# Patient Record
Sex: Male | Born: 1935 | Race: White | Hispanic: No | Marital: Married
Health system: Southern US, Community
[De-identification: ages and names within clinical notes are randomized; demographics above are authoritative.]

---

## 2004-10-29 ENCOUNTER — Inpatient Hospital Stay: Payer: Self-pay | Admitting: Infectious Diseases

## 2004-10-29 ENCOUNTER — Other Ambulatory Visit: Payer: Self-pay

## 2004-10-30 ENCOUNTER — Other Ambulatory Visit: Payer: Self-pay

## 2004-10-31 ENCOUNTER — Other Ambulatory Visit: Payer: Self-pay

## 2004-11-16 ENCOUNTER — Ambulatory Visit: Payer: Self-pay | Admitting: Nurse Practitioner

## 2004-12-28 ENCOUNTER — Ambulatory Visit: Payer: Self-pay | Admitting: Nurse Practitioner

## 2005-07-12 ENCOUNTER — Ambulatory Visit: Payer: Self-pay | Admitting: Family Medicine

## 2006-10-09 ENCOUNTER — Ambulatory Visit: Payer: Self-pay | Admitting: *Deleted

## 2006-12-25 ENCOUNTER — Ambulatory Visit: Payer: Self-pay | Admitting: Family Medicine

## 2007-04-03 ENCOUNTER — Ambulatory Visit: Payer: Self-pay | Admitting: Specialist

## 2010-06-11 ENCOUNTER — Ambulatory Visit: Payer: Self-pay | Admitting: Unknown Physician Specialty

## 2011-11-01 ENCOUNTER — Ambulatory Visit: Payer: Self-pay | Admitting: Internal Medicine

## 2012-06-28 ENCOUNTER — Ambulatory Visit: Payer: Self-pay | Admitting: Family Medicine

## 2012-07-17 ENCOUNTER — Ambulatory Visit: Payer: Self-pay | Admitting: Family Medicine

## 2012-08-13 ENCOUNTER — Ambulatory Visit: Payer: Self-pay | Admitting: Internal Medicine

## 2012-09-18 ENCOUNTER — Ambulatory Visit: Payer: Self-pay | Admitting: Internal Medicine

## 2012-12-14 ENCOUNTER — Ambulatory Visit: Payer: Self-pay | Admitting: Family Medicine

## 2013-02-01 ENCOUNTER — Inpatient Hospital Stay: Payer: Self-pay | Admitting: Student

## 2013-02-01 LAB — BASIC METABOLIC PANEL
BUN: 16 mg/dL (ref 7–18)
Calcium, Total: 8.8 mg/dL (ref 8.5–10.1)
Co2: 24 mmol/L (ref 21–32)
Creatinine: 1.11 mg/dL (ref 0.60–1.30)
EGFR (African American): >60
Osmolality: 276 (ref 275–301)

## 2013-02-01 LAB — TROPONIN I: Troponin-I: <0.02 ng/mL

## 2013-02-01 LAB — URINALYSIS, COMPLETE
Bacteria: NONE SEEN
Bilirubin,UR: NEGATIVE
Blood: NEGATIVE
Glucose,UR: NEGATIVE mg/dL (ref 0–75)
Ph: 5 (ref 4.5–8.0)
Protein: 100
Specific Gravity: 1.031 (ref 1.003–1.030)
Squamous Epithelial: NONE SEEN

## 2013-02-01 LAB — CBC WITH DIFFERENTIAL/PLATELET
Eosinophil #: 0 10*3/uL (ref 0.0–0.7)
Eosinophil %: 0.1 %
HCT: 41.2 % (ref 40.0–52.0)
HGB: 13.3 g/dL (ref 13.0–18.0)
Lymphocyte #: 1 10*3/uL (ref 1.0–3.6)
Lymphocyte %: 5.8 %
MCH: 28.4 pg (ref 26.0–34.0)
MCHC: 32.3 g/dL (ref 32.0–36.0)
Monocyte #: 2.2 x10 3/mm — ABNORMAL HIGH (ref 0.2–1.0)
Neutrophil #: 13.6 10*3/uL — ABNORMAL HIGH (ref 1.4–6.5)
RDW: 14.5 % (ref 11.5–14.5)
WBC: 16.9 10*3/uL — ABNORMAL HIGH (ref 3.8–10.6)

## 2013-02-02 LAB — BASIC METABOLIC PANEL
Anion Gap: 9 (ref 7–16)
BUN: 14 mg/dL (ref 7–18)
Chloride: 104 mmol/L (ref 98–107)
Co2: 25 mmol/L (ref 21–32)
Creatinine: 1.11 mg/dL (ref 0.60–1.30)
EGFR (African American): >60
EGFR (Non-African Amer.): 60 — ABNORMAL LOW
Osmolality: 277 (ref 275–301)
Sodium: 138 mmol/L (ref 136–145)

## 2013-02-02 LAB — CBC WITH DIFFERENTIAL/PLATELET
Basophil #: 0.1 10*3/uL (ref 0.0–0.1)
Eosinophil #: 0 10*3/uL (ref 0.0–0.7)
HCT: 38.3 % — ABNORMAL LOW (ref 40.0–52.0)
Lymphocyte %: 7.3 %
MCV: 88 fL (ref 80–100)
Monocyte #: 1.7 x10 3/mm — ABNORMAL HIGH (ref 0.2–1.0)
Monocyte %: 12.8 %
Neutrophil %: 79.3 %
RBC: 4.36 10*6/uL — ABNORMAL LOW (ref 4.40–5.90)
RDW: 14.3 % (ref 11.5–14.5)

## 2013-02-03 LAB — CBC WITH DIFFERENTIAL/PLATELET
Basophil #: 0.1 10*3/uL (ref 0.0–0.1)
Basophil %: 0.7 %
Eosinophil #: 0.2 10*3/uL (ref 0.0–0.7)
Eosinophil %: 1.9 %
HCT: 39.6 % — ABNORMAL LOW (ref 40.0–52.0)
Lymphocyte #: 0.9 10*3/uL — ABNORMAL LOW (ref 1.0–3.6)
Lymphocyte %: 7.6 %
MCHC: 32.1 g/dL (ref 32.0–36.0)
Neutrophil #: 8.8 10*3/uL — ABNORMAL HIGH (ref 1.4–6.5)
Platelet: 200 10*3/uL (ref 150–440)
RBC: 4.49 10*6/uL (ref 4.40–5.90)
RDW: 14.5 % (ref 11.5–14.5)
WBC: 11.6 10*3/uL — ABNORMAL HIGH (ref 3.8–10.6)

## 2013-02-06 LAB — CREATININE, SERUM
Creatinine: 1.07 mg/dL (ref 0.60–1.30)
EGFR (African American): >60
EGFR (Non-African Amer.): >60

## 2013-02-06 LAB — CULTURE, BLOOD (SINGLE)

## 2013-03-01 ENCOUNTER — Ambulatory Visit: Payer: Self-pay | Admitting: Internal Medicine

## 2013-04-22 ENCOUNTER — Ambulatory Visit: Payer: Self-pay | Admitting: Internal Medicine

## 2013-06-26 ENCOUNTER — Inpatient Hospital Stay: Payer: Self-pay | Admitting: Internal Medicine

## 2013-06-26 LAB — COMPREHENSIVE METABOLIC PANEL
ALBUMIN: 2.9 g/dL — AB (ref 3.4–5.0)
ALK PHOS: 124 U/L — AB
Anion Gap: 8 (ref 7–16)
BUN: 16 mg/dL (ref 7–18)
Bilirubin,Total: 1 mg/dL (ref 0.2–1.0)
Calcium, Total: 9.4 mg/dL (ref 8.5–10.1)
Chloride: 104 mmol/L (ref 98–107)
Co2: 25 mmol/L (ref 21–32)
Creatinine: 0.95 mg/dL (ref 0.60–1.30)
GLUCOSE: 107 mg/dL — AB (ref 65–99)
OSMOLALITY: 275 (ref 275–301)
POTASSIUM: 3.4 mmol/L — AB (ref 3.5–5.1)
SGOT(AST): 30 U/L (ref 15–37)
SGPT (ALT): 17 U/L (ref 12–78)
SODIUM: 137 mmol/L (ref 136–145)
Total Protein: 8.4 g/dL — ABNORMAL HIGH (ref 6.4–8.2)

## 2013-06-26 LAB — CBC
HCT: 43.1 % (ref 40.0–52.0)
HGB: 14.1 g/dL (ref 13.0–18.0)
MCH: 28.3 pg (ref 26.0–34.0)
MCHC: 32.6 g/dL (ref 32.0–36.0)
MCV: 87 fL (ref 80–100)
PLATELETS: 248 10*3/uL (ref 150–440)
RBC: 4.96 10*6/uL (ref 4.40–5.90)
RDW: 14.8 % — AB (ref 11.5–14.5)
WBC: 14.7 10*3/uL — ABNORMAL HIGH (ref 3.8–10.6)

## 2013-06-26 LAB — TROPONIN I: Troponin-I: <0.02 ng/mL

## 2013-06-26 LAB — PRO B NATRIURETIC PEPTIDE: B-Type Natriuretic Peptide: 1009 pg/mL — ABNORMAL HIGH (ref 0–450)

## 2013-06-27 LAB — CBC WITH DIFFERENTIAL/PLATELET
BASOS ABS: 0 10*3/uL (ref 0.0–0.1)
BASOS PCT: 0.1 %
EOS ABS: 0 10*3/uL (ref 0.0–0.7)
EOS PCT: 0 %
HCT: 38.6 % — ABNORMAL LOW (ref 40.0–52.0)
HGB: 12.8 g/dL — ABNORMAL LOW (ref 13.0–18.0)
Lymphocyte #: 0.5 10*3/uL — ABNORMAL LOW (ref 1.0–3.6)
Lymphocyte %: 5 %
MCH: 28.6 pg (ref 26.0–34.0)
MCHC: 33.1 g/dL (ref 32.0–36.0)
MCV: 86 fL (ref 80–100)
MONOS PCT: 3.6 %
Monocyte #: 0.4 x10 3/mm (ref 0.2–1.0)
NEUTROS ABS: 9.8 10*3/uL — AB (ref 1.4–6.5)
Neutrophil %: 91.3 %
Platelet: 219 10*3/uL (ref 150–440)
RBC: 4.48 10*6/uL (ref 4.40–5.90)
RDW: 14.3 % (ref 11.5–14.5)
WBC: 10.7 10*3/uL — ABNORMAL HIGH (ref 3.8–10.6)

## 2013-06-27 LAB — URINALYSIS, COMPLETE
BACTERIA: NONE SEEN
Bilirubin,UR: NEGATIVE
Glucose,UR: >=500 mg/dL (ref 0–75)
LEUKOCYTE ESTERASE: NEGATIVE
NITRITE: NEGATIVE
Ph: 5 (ref 4.5–8.0)
Protein: 100
RBC,UR: 1 /HPF (ref 0–5)
SPECIFIC GRAVITY: 1.031 (ref 1.003–1.030)
Squamous Epithelial: <1
WBC UR: 6 /HPF (ref 0–5)

## 2013-06-27 LAB — MAGNESIUM: MAGNESIUM: 2.1 mg/dL

## 2013-06-27 LAB — POTASSIUM: Potassium: 3.8 mmol/L (ref 3.5–5.1)

## 2013-06-28 LAB — CBC WITH DIFFERENTIAL/PLATELET
BASOS PCT: 0.1 %
Basophil #: 0 10*3/uL (ref 0.0–0.1)
EOS ABS: 0 10*3/uL (ref 0.0–0.7)
Eosinophil %: 0 %
HCT: 40.9 % (ref 40.0–52.0)
HGB: 13.2 g/dL (ref 13.0–18.0)
LYMPHS PCT: 4.6 %
Lymphocyte #: 0.8 10*3/uL — ABNORMAL LOW (ref 1.0–3.6)
MCH: 28.1 pg (ref 26.0–34.0)
MCHC: 32.3 g/dL (ref 32.0–36.0)
MCV: 87 fL (ref 80–100)
MONOS PCT: 5.1 %
Monocyte #: 0.9 x10 3/mm (ref 0.2–1.0)
NEUTROS PCT: 90.2 %
Neutrophil #: 15.7 10*3/uL — ABNORMAL HIGH (ref 1.4–6.5)
Platelet: 269 10*3/uL (ref 150–440)
RBC: 4.69 10*6/uL (ref 4.40–5.90)
RDW: 14.9 % — ABNORMAL HIGH (ref 11.5–14.5)
WBC: 17.4 10*3/uL — ABNORMAL HIGH (ref 3.8–10.6)

## 2013-07-01 LAB — CULTURE, BLOOD (SINGLE)

## 2013-07-01 LAB — PATHOLOGY REPORT

## 2013-07-03 LAB — EXPECTORATED SPUTUM ASSESSMENT W REFEX TO RESP CULTURE

## 2013-07-12 ENCOUNTER — Ambulatory Visit: Payer: Self-pay | Admitting: Internal Medicine

## 2013-12-15 ENCOUNTER — Inpatient Hospital Stay: Payer: Self-pay | Admitting: Internal Medicine

## 2013-12-15 LAB — COMPREHENSIVE METABOLIC PANEL
ANION GAP: 11 (ref 7–16)
AST: 39 U/L — AB (ref 15–37)
Albumin: 3.5 g/dL (ref 3.4–5.0)
Alkaline Phosphatase: 143 U/L — ABNORMAL HIGH
BUN: 17 mg/dL (ref 7–18)
Bilirubin,Total: 1 mg/dL (ref 0.2–1.0)
CALCIUM: 8.9 mg/dL (ref 8.5–10.1)
CO2: 23 mmol/L (ref 21–32)
Chloride: 105 mmol/L (ref 98–107)
Creatinine: 1.12 mg/dL (ref 0.60–1.30)
EGFR (African American): >60
EGFR (Non-African Amer.): >60
Glucose: 157 mg/dL — ABNORMAL HIGH (ref 65–99)
OSMOLALITY: 282 (ref 275–301)
Potassium: 5 mmol/L (ref 3.5–5.1)
SGPT (ALT): 41 U/L
SODIUM: 139 mmol/L (ref 136–145)
Total Protein: 7.8 g/dL (ref 6.4–8.2)

## 2013-12-15 LAB — CBC
HCT: 49.1 % (ref 40.0–52.0)
HGB: 15.5 g/dL (ref 13.0–18.0)
MCH: 27.4 pg (ref 26.0–34.0)
MCHC: 31.6 g/dL — AB (ref 32.0–36.0)
MCV: 87 fL (ref 80–100)
PLATELETS: 161 10*3/uL (ref 150–440)
RBC: 5.68 10*6/uL (ref 4.40–5.90)
RDW: 15.1 % — AB (ref 11.5–14.5)
WBC: 19 10*3/uL — ABNORMAL HIGH (ref 3.8–10.6)

## 2013-12-15 LAB — URINALYSIS, COMPLETE
BACTERIA: NONE SEEN
Bilirubin,UR: NEGATIVE
Blood: NEGATIVE
Glucose,UR: NEGATIVE mg/dL (ref 0–75)
Leukocyte Esterase: NEGATIVE
NITRITE: NEGATIVE
PH: 5 (ref 4.5–8.0)
Protein: 100
RBC,UR: 4 /HPF (ref 0–5)
SQUAMOUS EPITHELIAL: NONE SEEN
Specific Gravity: >1.06 (ref 1.003–1.030)
WBC UR: 1 /HPF (ref 0–5)

## 2013-12-15 LAB — TROPONIN I

## 2013-12-15 LAB — LIPASE, BLOOD: Lipase: 53 U/L — ABNORMAL LOW (ref 73–393)

## 2013-12-16 LAB — CBC WITH DIFFERENTIAL/PLATELET
BASOS PCT: 0.1 %
Basophil #: 0 10*3/uL (ref 0.0–0.1)
EOS ABS: 0 10*3/uL (ref 0.0–0.7)
EOS PCT: 0 %
HCT: 57.1 % — ABNORMAL HIGH (ref 40.0–52.0)
HGB: 18 g/dL (ref 13.0–18.0)
Lymphocyte #: 1.2 10*3/uL (ref 1.0–3.6)
Lymphocyte %: 5.3 %
MCH: 27.5 pg (ref 26.0–34.0)
MCHC: 31.6 g/dL — ABNORMAL LOW (ref 32.0–36.0)
MCV: 87 fL (ref 80–100)
MONOS PCT: 10.2 %
Monocyte #: 2.3 x10 3/mm — ABNORMAL HIGH (ref 0.2–1.0)
Neutrophil #: 19.4 10*3/uL — ABNORMAL HIGH (ref 1.4–6.5)
Neutrophil %: 84.4 %
Platelet: 184 10*3/uL (ref 150–440)
RBC: 6.56 10*6/uL — ABNORMAL HIGH (ref 4.40–5.90)
RDW: 15.6 % — ABNORMAL HIGH (ref 11.5–14.5)
WBC: 23 10*3/uL — AB (ref 3.8–10.6)

## 2013-12-16 LAB — COMPREHENSIVE METABOLIC PANEL
ALT: 23 U/L
AST: 12 U/L — AB (ref 15–37)
Albumin: 2.9 g/dL — ABNORMAL LOW (ref 3.4–5.0)
Alkaline Phosphatase: 130 U/L — ABNORMAL HIGH
Anion Gap: 11 (ref 7–16)
BILIRUBIN TOTAL: 1 mg/dL (ref 0.2–1.0)
BUN: 16 mg/dL (ref 7–18)
Calcium, Total: 8.9 mg/dL (ref 8.5–10.1)
Chloride: 104 mmol/L (ref 98–107)
Co2: 24 mmol/L (ref 21–32)
Creatinine: 1.05 mg/dL (ref 0.60–1.30)
EGFR (African American): >60
EGFR (Non-African Amer.): >60
Glucose: 170 mg/dL — ABNORMAL HIGH (ref 65–99)
Osmolality: 283 (ref 275–301)
Potassium: 4.5 mmol/L (ref 3.5–5.1)
SODIUM: 139 mmol/L (ref 136–145)
Total Protein: 7 g/dL (ref 6.4–8.2)

## 2013-12-17 LAB — BASIC METABOLIC PANEL
Anion Gap: 5 — ABNORMAL LOW (ref 7–16)
BUN: 22 mg/dL — AB (ref 7–18)
CHLORIDE: 109 mmol/L — AB (ref 98–107)
CREATININE: 0.98 mg/dL (ref 0.60–1.30)
Calcium, Total: 8 mg/dL — ABNORMAL LOW (ref 8.5–10.1)
Co2: 24 mmol/L (ref 21–32)
EGFR (African American): >60
EGFR (Non-African Amer.): >60
GLUCOSE: 176 mg/dL — AB (ref 65–99)
Osmolality: 283 (ref 275–301)
Potassium: 5 mmol/L (ref 3.5–5.1)
Sodium: 138 mmol/L (ref 136–145)

## 2013-12-17 LAB — CBC WITH DIFFERENTIAL/PLATELET
Basophil #: 0.1 10*3/uL (ref 0.0–0.1)
Basophil %: 0.4 %
EOS PCT: 0 %
Eosinophil #: 0 10*3/uL (ref 0.0–0.7)
HCT: 44.6 % (ref 40.0–52.0)
HGB: 14.1 g/dL (ref 13.0–18.0)
LYMPHS PCT: 3.1 %
Lymphocyte #: 0.7 10*3/uL — ABNORMAL LOW (ref 1.0–3.6)
MCH: 27.8 pg (ref 26.0–34.0)
MCHC: 31.7 g/dL — AB (ref 32.0–36.0)
MCV: 88 fL (ref 80–100)
MONO ABS: 1.5 x10 3/mm — AB (ref 0.2–1.0)
MONOS PCT: 7 %
Neutrophil #: 19.1 10*3/uL — ABNORMAL HIGH (ref 1.4–6.5)
Neutrophil %: 89.5 %
Platelet: 140 10*3/uL — ABNORMAL LOW (ref 150–440)
RBC: 5.09 10*6/uL (ref 4.40–5.90)
RDW: 15.5 % — AB (ref 11.5–14.5)
WBC: 21.3 10*3/uL — AB (ref 3.8–10.6)

## 2013-12-17 LAB — MAGNESIUM: MAGNESIUM: 1.6 mg/dL — AB

## 2013-12-17 LAB — PHOSPHORUS: Phosphorus: 2.3 mg/dL — ABNORMAL LOW (ref 2.5–4.9)

## 2013-12-18 LAB — CBC WITH DIFFERENTIAL/PLATELET
BASOS PCT: 0.4 %
Basophil #: 0.1 10*3/uL (ref 0.0–0.1)
EOS ABS: 0 10*3/uL (ref 0.0–0.7)
EOS PCT: 0 %
HCT: 38.2 % — ABNORMAL LOW (ref 40.0–52.0)
HGB: 12.1 g/dL — ABNORMAL LOW (ref 13.0–18.0)
LYMPHS ABS: 0.6 10*3/uL — AB (ref 1.0–3.6)
Lymphocyte %: 2.8 %
MCH: 27.5 pg (ref 26.0–34.0)
MCHC: 31.7 g/dL — AB (ref 32.0–36.0)
MCV: 87 fL (ref 80–100)
MONOS PCT: 8.6 %
Monocyte #: 1.9 x10 3/mm — ABNORMAL HIGH (ref 0.2–1.0)
NEUTROS PCT: 88.2 %
Neutrophil #: 19.4 10*3/uL — ABNORMAL HIGH (ref 1.4–6.5)
Platelet: 154 10*3/uL (ref 150–440)
RBC: 4.41 10*6/uL (ref 4.40–5.90)
RDW: 15.2 % — ABNORMAL HIGH (ref 11.5–14.5)
WBC: 22.1 10*3/uL — AB (ref 3.8–10.6)

## 2013-12-18 LAB — BASIC METABOLIC PANEL
Anion Gap: 6 — ABNORMAL LOW (ref 7–16)
BUN: 17 mg/dL (ref 7–18)
CHLORIDE: 105 mmol/L (ref 98–107)
CREATININE: 0.91 mg/dL (ref 0.60–1.30)
Calcium, Total: 7.7 mg/dL — ABNORMAL LOW (ref 8.5–10.1)
Co2: 29 mmol/L (ref 21–32)
GLUCOSE: 151 mg/dL — AB (ref 65–99)
Osmolality: 284 (ref 275–301)
Potassium: 4.4 mmol/L (ref 3.5–5.1)
Sodium: 140 mmol/L (ref 136–145)

## 2013-12-18 LAB — PHOSPHORUS
Phosphorus: 1.9 mg/dL — ABNORMAL LOW (ref 2.5–4.9)
Phosphorus: 2.4 mg/dL — ABNORMAL LOW (ref 2.5–4.9)

## 2013-12-18 LAB — MAGNESIUM: MAGNESIUM: 2.2 mg/dL

## 2013-12-19 ENCOUNTER — Ambulatory Visit: Payer: Self-pay | Admitting: Internal Medicine

## 2013-12-19 LAB — CBC WITH DIFFERENTIAL/PLATELET
BASOS PCT: 0.1 %
Basophil #: 0 10*3/uL (ref 0.0–0.1)
EOS PCT: 0 %
Eosinophil #: 0 10*3/uL (ref 0.0–0.7)
HCT: 39.6 % — ABNORMAL LOW (ref 40.0–52.0)
HGB: 12.5 g/dL — AB (ref 13.0–18.0)
LYMPHS ABS: 0.7 10*3/uL — AB (ref 1.0–3.6)
LYMPHS PCT: 4.3 %
MCH: 27.1 pg (ref 26.0–34.0)
MCHC: 31.7 g/dL — ABNORMAL LOW (ref 32.0–36.0)
MCV: 86 fL (ref 80–100)
Monocyte #: 1.2 x10 3/mm — ABNORMAL HIGH (ref 0.2–1.0)
Monocyte %: 7.9 %
Neutrophil #: 13.8 10*3/uL — ABNORMAL HIGH (ref 1.4–6.5)
Neutrophil %: 87.7 %
Platelet: 139 10*3/uL — ABNORMAL LOW (ref 150–440)
RBC: 4.62 10*6/uL (ref 4.40–5.90)
RDW: 15.3 % — ABNORMAL HIGH (ref 11.5–14.5)
WBC: 15.7 10*3/uL — ABNORMAL HIGH (ref 3.8–10.6)

## 2013-12-19 LAB — BASIC METABOLIC PANEL
Anion Gap: 7 (ref 7–16)
BUN: 16 mg/dL (ref 7–18)
Calcium, Total: 8.1 mg/dL — ABNORMAL LOW (ref 8.5–10.1)
Chloride: 104 mmol/L (ref 98–107)
Co2: 30 mmol/L (ref 21–32)
Creatinine: 0.75 mg/dL (ref 0.60–1.30)
EGFR (African American): >60
Glucose: 97 mg/dL (ref 65–99)
Osmolality: 282 (ref 275–301)
Potassium: 4.7 mmol/L (ref 3.5–5.1)
SODIUM: 141 mmol/L (ref 136–145)

## 2013-12-19 LAB — PHOSPHORUS: Phosphorus: 2.3 mg/dL — ABNORMAL LOW (ref 2.5–4.9)

## 2013-12-19 LAB — MAGNESIUM: Magnesium: 2.3 mg/dL

## 2014-01-14 ENCOUNTER — Ambulatory Visit: Payer: Self-pay | Admitting: Internal Medicine

## 2014-01-14 DEATH — deceased

## 2014-06-07 NOTE — Discharge Summary (Signed)
PATIENT NAME:  Herma ArdHESTER, Aydon A MR#:  [id number] DATE OF BIRTH:  Jul 18, 1924  DATE OF ADMISSION:  02/01/2013 DATE OF DISCHARGE:  02/06/2013    CONSULTANTS: Dr. Belia HemanKasa from pulmonary, and physical therapy.   CHIEF COMPLAINT: Shortness of breath.   DISCHARGE DIAGNOSES: 1.  Acute respiratory failure secondary to pneumonia.  2.  Severe sepsis with respiratory failure and pneumonia, now resolved.  3.  Likely underlying chronic interstitial lung disease and pulmonary fibrosis, progressive.  4.  Chronic obstructive pulmonary disease.  5.  Obstructive sleep apnea.  6.  Coronary artery disease.  7.  Hyperlipidemia.  8.  Tachycardiac.  9.  Constipation.  10.  History of asthma.   DISCHARGE MEDICATIONS: Lipitor 10 mg daily, aspirin 81 mg daily, loratadine 10 mg daily, lactobacillus 1 cap 2 times a day for 3 weeks, doxycycline 100 mg every 12 hours for 5 days, Augmentin 875 mg 2 times a day for 5 days, tiotropium 18 mcg inhaled 1 cap daily, Symbicort 160/4.5 mcg 2 puffs 2 times a day, prednisone taper 40 mg for 2 days, then taper by 10 mg every 2 days until done in 8 days, Ventolin 2 puffs 4 times a day as needed for shortness of breath or wheezing.   He will be going home with home health, PT and R.N. with 4 liters of oxygen around-the-clock continuous.   DIET: Low sodium.   ACTIVITY: As tolerated. No exertional activity or heavy lifting.   Please follow with PCP within 1 to 2 weeks.   DISPOSITION: Home with home health.   SIGNIFICANT LABS AND IMAGING: Initial white count of 16.9, hemoglobin 13.3. Troponin negative. BUN 16, creatinine 1.1. Last WBC of 11.6. Blood cultures: No growth to date. Rapid flu was negative. Sputum cultures did not grow any specific organism, but it did show broadly some gram-positive cocci, a few gram-negative  diplococci, some gram-positive rods. UA not suggestive of infection. Urine strep and Legionella antigens negative. CT chest with contrast on the 19th of December  showing moderate subpleural reticulation and fibrosis, lower lobe predominant, compatible with chronic interstitial lung disease, which has progressed from 2009, and also superimposed ground-glass opacity bilateral lower lobes, suspicious for pneumonia with underlying emphysematous changes which are mild.   HISTORY OF PRESENT ILLNESS AND HOSPITAL COURSE: For full details of H and P, please take a look at the dictation on December 19 by Dr. Jacques NavyAhmadzia, but briefly, this is a pleasant 81 year old with obstructive sleep apnea, chronic obstructive pulmonary disease, hyperlipidemia, who came in for cough, shortness of breath. Has been having symptoms for some time now and has undergone 3 rounds of antibiotics, which we later found was Levaquin without any significant improvement. He was found to have a fever of greater than 101, hypoxic with O2 sat of 85% on room air. He was referred here by his PCP and was noted to have leukocytosis requiring oxygenation and a CAT scan which was consistent with pneumonia, as well as chronic lung findings. He was admitted to the hospitalist service with broad-spectrum antibiotic initiation, given failed outpatient therapy. He was noted to have severe sepsis per criteria, including having SIRS criteria, pneumonia and respiratory failure. He was also started on some cough medicine. He was started on nebs, as well as pulmonary was consulted, and he was seen by Dr. Belia HemanKasa. In regards to the severe sepsis, all of the cultures have been negative and sputum cultures did not go anything specific. He was weaned off of the broad-spectrum antibiotics, which he  was on initially, vanc, Zosyn and Levaquin to Augmentin and doxycycline, without Levaquin, as he had that in the past without significant improvement. The sepsis  resolved.    In regards to the acute respiratory failure, this is likely secondary to pneumonia; however, I believe that chronic interstitial lung disease and some fibrosis also  play a role. I believe that he will need oxygen upon discharge and likely long term. He will follow with Dr. Welton Flakes and PCP as an outpatient. Oxygen has been arranged for him. It is possible that he has an element of chronic respiratory failure, which had not manifested itself and the pneumonia worsened his chronic lung disease and now he requires oxygen. He will be discharged on inhalers, as well as oxygen, prednisone taper and antibiotics. He did have bouts of tachycardia which was mainly respiratory driven. He was seen by PT and the recommendation was arranged home health PT and R.N. for him.   PHYSICAL EXAMINATION: VITAL SIGNS: On the day of discharge, his temperature is 98.2. Pulse rate is 88. Respiratory rate 20, blood pressure 128/67, O2 sat 92% on 4 liters.  GENERAL: The patient is an elderly male, comfortable, lying in bed, talking in full sentences.  HEENT: Normocephalic, atraumatic.  HEART: Auscultation of the heart sounds, irregularly irregular. No significant murmurs.  LUNGS: Much better air entry; however,  I still hear crackles at the bases, but improved aeration.  ABDOMEN:  Soft, nontender.  EXTREMITIES: Does not show any pitting edema.   He will be discharged with the above medications. He is full code.   Total time spent on this discharge is 35 minutes.  ____________________________ Krystal Eaton, MD sa:dmm D: 02/06/2013 11:25:09 ET T: 02/06/2013 12:02:05 ET JOB#: [id number]  cc: Krystal Eaton, MD, <Dictator> Yevonne Pax, MD Leanna Sato, MD Krystal Eaton MD ELECTRONICALLY SIGNED 02/26/2013 11:15

## 2014-06-07 NOTE — H&P (Signed)
PATIENT NAME:  Scott [name], Scott [name] MR#:  [id number] DATE OF BIRTH:  March 15, 1924  DATE OF ADMISSION:  02/01/2013  PRIMARY CARE PHYSICIAN:   Dr. Darreld [facility]cleanLinda [name] at [facility].  PRIMARY PULMONOLOGIST:  Dr. Welton [name].   REFERRING PHYSICIAN:  Dr. Shaune [name].  REASON FOR ADMISSION:  Cough, shortness of breath.   HISTORY OF PRESENT ILLNESS:  The patient is [name] pleasant 81 year old male with [name] history of obstructive sleep apnea,  CCOPD and hyperlipidemia, who is here for the above chief complaint. The patient, of note, apparently has been having pulmonary issues. He states that month and [name] half to two months ago, he was diagnosed with [name] touch of pneumonia. He finished 3 rounds of antibiotics, which he does not know what. His last antibiotic use was about 3 weeks ago. He presents after having cough, shortness of breath for the last 2 days, [name] fever last night. He went to [name] PCP where he was found with [name] fever of greater than 101 and hypoxic to 85% on room air. Of note, he is not on oxygen at home. The rapid flu was done, which was negative, and he was referred here. He has leukocytosis of about 16,000 and is requiring oxygenation. He does not know which antibiotics he took. Hospitalist service were contacted for further evaluation and management. He does have some pleuritic chest pain, some shortness of breath.   PAST MEDICAL HISTORY:  1.  [name] history of constipation.  2.  [name] history of pneumonia recently as above.  3.  COPD.  4.  Asthma.  5.  Obstructive sleep apnea.  6.  CAD.  7.  Hyperlipidemia.   OUTPATIENT MEDICATIONS:  1.  Aspirin 81 mg daily.  2.  Lipitor 10 mg daily.  3.  Nebulizers, which he does not use. 4.   Proventil p.r.n., which he does not use much.  5.  Symbicort 160/4.5 mcg 2 puffs 2 times [name] day.  6.  Loratadine 10 mg daily.   ALLERGIES:  Denies any allergies.   SOCIAL HISTORY:  Long-time smoker, quit in 1990. No alcohol or drug use. Lives with wife.   FAMILY HISTORY:  Mom with dementia, possible  CHF.  REVIEW OF SYSTEMS:  CONSTITUTIONAL:  Denies having any weight gain, but has lost about 8 pounds intentionally because he wants to lose weight. Positive for fevers as well.  EYES:  Denies blurry vision or double vision.  EARS, NOSE, THROAT:  Has decreased hearing, has bilateral hearing aids, no sore throat.  RESPIRATORY:  Positive for cough with yellowish sputum. Positive for shortness of breath and pleuritic chest pain when coughing or with deep respirations. Denies [name] history of COPD and obstructive sleep apnea.  CARDIOVASCULAR:  No swelling in the legs. Chest pain as above. No hypertension or CHF.  GASTROINTESTINAL:  Has abdominal pain from all his coughing bouts. No black stools, tarry stools, melena.  GENITOURINARY:  Denies dysuria, hematuria.  HEMATOLOGIC AND LYMPHATIC:  Denies anemia or easy bruising.  SKIN:  No rashes.  MUSCULOSKELETAL:  Has some chronic knee pains.  NEUROLOGIC:  No focal weakness or numbness.  PSYCHIATRIC:  No anxiety or depression.   PHYSICAL EXAMINATION:  VITAL SIGNS:  Temperature noted to be 98.9 here, but per outpatient PCP's office notes, had [name] temperature of 101.5 with [name] pulse rate of 128; here the pulse is 122, respiratory rate is 22, blood pressure 103/56, O2 sat 94% on oxygen.  GENERAL:  [name] well-developed male, lying in bed, talking in full sentences, however.  HEENT:  Normocephalic, atraumatic. Pupils are equal and reactive. Anicteric sclerae. He has bilateral hearing aids and moist mucous membranes.  NECK:  Supple. No thyroid tenderness. No cervical lymphadenopathy.  CARDIOVASCULAR:  S1, S2, tachycardic. No significant murmurs, rubs or gallops.  LUNGS:  Bilateral mid base and lower crackles, good air entry on the upper bases. No significant wheezing.  ABDOMEN:  Soft, nontender, nondistended. Positive bowel sounds in all quadrants.  EXTREMITIES:  No pitting edema.  NEUROLOGIC:  Cranial nerves II through XII grossly intact. Strength is 5/5 in all  extremities. Sensation intact to light touch.  SKIN:  No obvious rashes or lesions.  PSYCHIATRIC:  Awake, alert, oriented x 3, cooperative.   LABORATORY, DIAGNOSTIC, AND RADIOLOGICAL DATA:  Glucose 116, BUN 16, creatinine 1.1, sodium 137, potassium 3.8. Troponin negative. White count of 16.9. Hemoglobin for 13.3, platelets of 192.   EKG:  [name] rate of 93 sinus rhythm, no acute ST elevations or depressions. Some Q-waves in inferior leads.   X-ray of the chest, PA and lateral, showing stable lower lobe predominant subpleural reticular fibrosis compatible chronic interstitial lung disease and superimposed mild patchy opacity in the right mid lower lung as possible pneumonia not excluded.   ASSESSMENT AND PLAN:  We have [name] pleasant 82 year old with chronic obstructive pulmonary disease, obstructive sleep apnea, [name] long-time smoker, who comes in with severe sepsis. This appears to be more of noncommunity-acquired pneumonia as he has finished 3 rounds of antibiotics without improvement and has failed outpatient therapy. Here, would admit him and start him on broad-spectrum antibiotics including Levaquin IV, vancomycin and Zosyn. Would obtain blood cultures, sputum cultures and urine Legionella and strep antigens. He has fever, leukocytosis, tachycardia with [name] suspected pneumonia in the setting of acute respiratory failure, so this qualifies severe sepsis with an oxygen requirement. Would start him on oxygen, around-the-clock nebulizers, obtain [name] CT of the chest for [name] better visualization as well as obtain [name] Pulmonary consult with his primary pulmonologist, Dr. Welton Flakes. He does appear to be having an element of chronic lung disease as seen on the x-ray, which also I suspect, in addition to his underlying obstructive sleep apnea and chronic obstructive pulmonary disease, has caused the respiratory failure, but I suspect that the predominant reason for his respiratory issues are [name] suspected pneumonia. Would continue  Symbicort, start him on Robitussin [facility] for his significant cough. I would start him on heparin for deep vein thrombosis prophylaxis as well as his Lipitor.   CODE STATUS:  The patient is [name] FULL CODE.   TOTAL TIME SPENT: 50 minutes.   ____________________________ Krystal Eaton, MD sa:jm D: 02/01/2013 14:59:00 ET T: 02/01/2013 15:17:27 ET JOB#: [id number]  cc: Yevonne Pax, MD Leanna Sato, MD Krystal Eaton, MD, <Dictator> Krystal Eaton MD ELECTRONICALLY SIGNED 02/26/2013 11:10

## 2014-06-07 NOTE — H&P (Signed)
PATIENT NAME:  Scott [name], Scott [name] MR#:  [id number] DATE OF BIRTH:  [birth year]  DATE OF ADMISSION:  12/15/2013  REFERRING PHYSICIAN: Su Ley, M.D.   PRIMARY CARE PHYSICIAN: Leanna Sato, M.D., at [facility].  PRIMARY PULMONOLOGIST: Yevonne Pax, M.D.   PRIMARY CARDIOLOGIST: Lamar Blinks, M.D.   CHIEF COMPLAINT:  Abdominal pain, which started around 9 p.m. last night.   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 last night. The patient states that he was in his usual state of health until that episode of abdominal pain. The patient continued to have worsening pain; hence, called the EMS, who brought him to the Emergency Room for further evaluation. No associated nausea or vomiting, no fever. Chronic cough, which is stable. Denies any chest pain, dizziness, or loss of consciousness. No urinary symptoms such as hematuria, frequency or urgency.   In the Emergency Room, the patient was evaluated by the ED physician and found to have  partial small bowel obstruction on abdominal x-ray, as well as CT of the abdomen, and his blood work showed elevated white blood cell count of 19,000, and mildly elevated liver function tests. General surgery was consulted for  partial small bowel obstruction, who opined the patient is not a surgical candidate in view of his chronic comorbid medical conditions, as well as no acute indication for surgical intervention at this time. Hence, recommended by surgery, needs to have medical management and admission to medical team. The patient has received IV morphine, following which his  pain is under control at this time, and he did have 1 vomiting while in the Emergency Room after he had the p.o. contrast, but otherwise denies any nausea or vomiting at this time.   PAST MEDICAL HISTORY: 1. COPD/pulmonary fibrosis status post stem cell transplantation in August 2015, on home oxygen.  2. History of myocardial infarction status post stent.  3. Hyperlipidemia.  4. Tachycardia.  5. Sleep apnea on CPAP.    PAST SURGICAL HISTORY: Status post stem cell transplantation for pulmonary fibrosis in August 2015.   ALLERGIES: No known drug allergies.    HOME MEDICATIONS: 1. Albuterol/ipratropium 2.5/0.5 mg inhalation 4 times a day as needed.  2. Alphagan ophthalmic eyedrops 0.1% one drop each affected eye 2 times a day.  3. Diltiazem ER 120 mg 1 tablet once a day.  4. Fluticasone nasal spray 50 mcg 1 inhalation 1 spray each nostril once a day as needed.  5. Lipitor 10 mg 1 tablet once a day at bedtime.  6. Loratadine 10 mg tablet 1 tablet orally once a day as needed.  7. Pantoprazole 40 mg tablet 1 tablet 2 times a day.  8. Symbicort 160 mcg/4.5 inhalation 1 puff 2 times a day.  9. Tylenol as needed.  10. Ventolin  HFA 90 mcg inhalation 2 puffs 4 times a day as needed for wheezing.    SOCIAL HISTORY: He is married, lives with his wife. History of smoking in the past, quit in 1991. No alcohol usage. Denies any substance usage. He is minimally ambulatory at home, basically from bed to bathroom and dining table.   FAMILY HISTORY: Mother had dementia and  congestive heart failure.   REVIEW OF SYSTEMS:  CONSTITUTIONAL: Negative for fever. Chronic fatigue and shortness of breath secondary due to COPD/pulmonary fibrosis, present.  EYES: Denies any blurred vision or double vision. No pain. No redness. No inflammation.  ENT: Negative for tinnitus, ear pain, epistaxis, nasal discharge, or difficulty swallowing. Chronic hearing loss, and uses hearing aids.  RESPIRATORY: Chronic cough  present, secondary due to COPD and pulmonary fibrosis, on home oxygen. No painful respirations.   CARDIOVASCULAR: Negative for chest pain. No pedal edema. No palpitations, no syncope or dizziness.  GASTROINTESTINAL: Abdominal pain, which started around 9 p.m. as noted in the history of present illness. He did have 1 episode of emesis after he had p.o. contrast in the Emergency Room; otherwise, denies any nausea or vomiting. He is passing flatus from below. No blood in the stools.  GENITOURINARY: Negative for dysuria, hematuria, frequency or urgency.  ENDOCRINE: Negative for polyuria, polydipsia. No heat or cold intolerance.  HEMATOLOGIC: Negative for anemia, easy bruising or bleeding.  INTEGUMENTARY: Negative for acne, skin rash or lesions.  MUSCULOSKELETAL: Negative for any significant arthritic pains or swelling.  NEUROLOGICAL: Negative for focal weakness or numbness. No history of CVA, TIA or seizure disorder.  PSYCHIATRIC: Negative for anxiety, insomnia, depression.   PHYSICAL EXAMINATION:  VITAL SIGNS: Temperature 97.6 degrees Fahrenheit, pulse rate 94 per minute, respirations 18 per minute. Blood pressure 136/82, oxygen saturations 94% on oxygen supplementation, which is 3 liters.  GENERAL: Well developed, well nourished male, pleasant and cooperative, not in acute distress, alert and oriented.  HEAD: Atraumatic, normocephalic.  EYES: Pupils equal and react to light. No conjunctival pallor. No scleral icterus. Extraocular movements intact.  NOSE: No nasal lesions. No drainage.  EARS: No drainage. No external lesions.  ORAL CAVITY: No mucosal lesions. No exudates. No masses.  NECK: Supple. No JVD. No thyromegaly. No carotid bruit. Full range of motion of the neck without pain.  RESPIRATORY: Bilateral vesicular breath sounds with prolonged expiration present. A few rhonchi present and a few rales at the bases bilaterally present.  CARDIOVASCULAR: S1, S2 regular, no murmurs appreciated.  Peripheral pulses equal in the femoral, carotid, and pedal pulses. No pedal edema.  GASTROINTESTINAL: Abdomen is soft, nontender. Bowel sounds sluggish in all 4 quadrants. No distention. No rebound, no guarding, no rigidity.  GENITOURINARY: Deferred.  MUSCULOSKELETAL: No tenderness or effusion. Full range of motion in all areas. Strength and tone equal bilaterally.  SKIN: Inspection within normal limits.  LYMPHATICS: No cervical lymphadenopathy.  VASCULAR: Good dorsalis pedis and posterior tibial pulses.  NEUROLOGICAL: Alert, awake and oriented x 3. Cranial nerves II through XII grossly intact. No focal sensory or motor deficit. Motor strength is 5/5 in both upper and lower extremities equally. DTRs 2+ bilaterally and symmetrical.  PSYCHIATRIC: Judgment and insight are adequate. Alert and oriented x3. Memory and mood within normal limits.   LABORATORY DATA: Serum glucose 157, BUN 17, creatinine 1.12, sodium 139, potassium 5.0, chloride 105, bicarbonate 23, serum calcium 8.9, lipase 53, total protein 7.8, serum albumin 3.5, total bilirubin 1.0, alkaline phosphatase 143, AST 39, ALT 41, troponin less than 0.02. WBC 19.0, hemoglobin 15.5, hematocrit 49.1, platelet count normal, lactic acid venous 1.8.   IMAGING STUDIES: Three-way abdominal x-ray: Impression, slightly distended small bowel loops in the mid abdomen. The finding is suggestive of a partial small bowel obstruction.   CT of the abdomen and pelvis: Partial small bowel obstruction with edema in the mesentery. No masses.   ASSESSMENT AND PLAN: An  81 year old, Caucasian male, with a past medical history significant for chronic obstructive pulmonary disease, pulmonary fibrosis status post stem cell transplantation in August 2015 on home oxygen 3 to 4 liters per minute, history of myocardial infarction status post stent in the remote past, history of hyperlipidemia, tachycardia, who presents with acute onset of abdominal pain started around 9 p.m.  last night, diagnosed to have a partial small bowel obstruction, evaluated by the general surgeon in the Emergency Room, and deemed to be not a surgical candidate and no acute surgical indication at this time. Hence, advised medical management, admitted to the medical team for further management.   1. Partial small bowel obstruction, evaluated by the surgeon, deemed not a surgical candidate and no surgical intervention at this time. Hence, advised medical management. Plan: Admit to medical/surgical floor, n.p.o. except ice chips and medications. Follow up general surgery's advice.  2. Leukocytosis. No fever. The patient was not sick prior to the episode. Likely secondary due to stress associated with partial small bowel obstruction. Plan: Monitor temperature curve. We will follow up CBC and consider further work-up accordingly.  3. History of chronic obstructive pulmonary disease/pulmonary fibrosis status post stem cell transplantation in August 2015, on home oxygen. The patient is on nebulizers and inhalers at home. Stable. Continue home oxygen and nebulizers.  4. History of myocardial infarction status post stent in the remote past. Stable clinically. Continue monitoring.  5. History of hyperlipidemia on statin, continue same.  6. History of tachycardia on diltiazem, heart rate controlled. Continue same. 7. Sleep apnea on CPAP. Continue same.  8. Miscellaneous. Deep vein thrombosis prophylaxis with subcutaneous Lovenox.   Gastrointestinal prophylaxis with Protonix.   CODE STATUS: Full Code.   TIME SPENT: 55 minutes.    ____________________________ Crissie Figures, MD enr:JT D: 12/15/2013 05:18:40 ET T: 12/15/2013 09:20:48 ET JOB#: [id number]  cc: Crissie Figures, MD, <Dictator> Leanna Sato, MD Crissie Figures MD ELECTRONICALLY SIGNED 12/15/2013 18:53

## 2014-06-07 NOTE — Consult Note (Signed)
PATIENT NAME:  Scott [name], Scott [name] MR#:  [id number] DATE OF BIRTH:  14-Jun-1924  DATE OF CONSULTATION:  06/28/2013  REFERRING PHYSICIAN:  Shaune Pollack, MD CONSULTING PHYSICIAN:  Hardie Shackleton. Colin Benton, PA-C ATTENDING GASTROENTEROLOGIST:  Ezzard Standing. Oh, MD  REASON FOR CONSULTATION: Coffee-ground emesis.   HISTORY OF PRESENT ILLNESS: This is a pleasant 81 year old gentleman accompanied by multiple family members at bedside, who was initially admitted with concerns of a cough and shortness of breath. He was diagnosed with a COPD exacerbation and a pneumonia that was causing some sepsis and acute respiratory failure. He is currently on oxygen through a nasal cannula. His pulmonary status has improved, but unfortunately this morning he developed some coffee-ground emesis. Early this morning before eating or drinking anything, he felt a little sick to his stomach and ended up vomiting dark black color. He has also been seeing some spots visually and has been feeling a little bit dizzy. Over the past few hours, the dizziness has actually resolved. He does feel that he is breathing stronger and still has a lingering cough, but otherwise feels as though his respiratory status is quite strong today. His hemoglobin since admission has remained stable, with an admitting hemoglobin of 14.1. It was 12.8 yesterday and today it is 13.2. He does have a history of occasional indigestion, but no prior history to his knowledge of peptic ulcer disease, gastritis or H. pylori. He is denying any dysphagia. He has been kept n.p.o. today except for ice chips and is tolerating this well. Currently on speaking with the patient, he is not experiencing any nausea at the present time. He cannot recall any personal history of undergoing an EGD. No fever or chills. No current chest pain or shortness of breath.   PAST MEDICAL HISTORY: COPD, on 24-hour oxygen; obstructive sleep apnea, currently utilizing CPAP; history of coronary artery disease; dyslipidemia.    PAST SURGICAL HISTORY: Cardiac stent placement about 21 years ago.   HOME MEDICATIONS: Ventolin, Spiriva, Symbicort, loratadine, fluticasone, Lipitor, Cardizem, Alphagan, albuterol with ipratropium inhalation, and a baby aspirin.   SOCIAL HISTORY: The patient has a long-standing history of smoking, but did quit back in 1991. No current alcohol, tobacco or illicit drug use. He is married and lives at home with his wife.   FAMILY HISTORY: No known family history of GI malignancy, colon polyps or IBD.   REVIEW OF SYSTEMS: Ten-system review of systems was obtained on the patient. Pertinent positives are mentioned above and otherwise negative.   OBJECTIVE: VITAL SIGNS: Blood pressure 114/66, heart rate 95, respirations 18, temperature 97.5, bedside pulse oximetry 91%.  GENERAL: This is a pleasant 81 year old gentleman resting quietly and comfortably in bed in no acute distress. Alert and oriented x 3.  HEAD: Atraumatic, normocephalic.  NECK: Supple. No lymphadenopathy noted.  HEENT: Sclerae anicteric. Mucous membranes moist. He does have a nasal cannula in place.  LUNGS: Respirations are even and unlabored. Clear to auscultation bilateral anterior lung fields.  HEART: Regular rate and rhythm. S1, S2 noted.  ABDOMEN: Soft, nontender, nondistended. Normoactive bowel sounds noted in all 4 quadrants. No guarding or rebound. No masses, hernias or organomegaly appreciated.  PSYCHIATRIC: Appropriate mood and affect.  NEUROLOGIC: Cranial nerves II through XII are grossly intact.  EXTREMITIES: Negative for lower extremity edema, 2+ pulses noted in bilateral upper extremities.   LABORATORY DATA: White blood cells 17.4, hemoglobin 13.2, hematocrit 40.9, platelets 269, MCV 87. Troponins were negative. BNP is 1009. Sodium 137, potassium 3.8, BUN 16, creatinine 0.95, glucose  107, bilirubin 1.0, alkaline phosphatase 124, ALT 17, AST 30.   IMAGING: A chest x-ray was obtained on the patient showing no  evidence of pulmonary edema. There was chronic interstitial prominence and fibrotic changes. There were also hazy bilateral infiltrates.   ASSESSMENT: 1.  Chronic obstructive pulmonary disease exacerbation.  2.  Pneumonia causing sepsis and acute on chronic respiratory failure.  3.  Coffee-ground emesis beginning earlier this morning. Currently hemoglobin is stable, as are his vitals.  4.  History of obstructive sleep apnea, currently on CPAP.   PLAN: I have discussed this patient's case in detail with Dr. Lutricia FeilPaul Oh, who is involved in the development of the patient's plan of care. At this time, because there were concerns of him having coffee-ground emesis this morning, we do feel that he can benefit from an upper EGD. Certainly with his recent pulmonary status, we would need to make sure he is cleared by anesthesiology prior to us proceeding with this test. Risks and benefits were outlined in detail for the patient including risk of bleeding, perforation, infection, and anesthesia, and he verbalized understanding and is in agreement to proceed. We do recommend continuing to keep a close eye on his hemoglobin and keeping him n.p.o. status in the event that we can proceed with an EGD later on this afternoon. All questions were answered. Further recommendations pending above and per clinical course.   Thank you so much for this consultation and for allowing us to participate in the patient's plan of care.   ATTENDING GASTROENTEROLOGIST: Ezzard StandingPaul Y. Bluford Kaufmannh, MD  ____________________________ Hardie ShackletonKaryn M. Makaelah Cranfield, PA-C kme:jcm D: 06/28/2013 13:14:35 ET T: 06/28/2013 13:30:43 ET JOB#: [id number]  cc: Hardie ShackletonKaryn M. Anaiz Qazi, PA-C, <Dictator> Hardie ShackletonKARYN M Maxx Calaway PA ELECTRONICALLY SIGNED 06/28/2013 14:03

## 2014-06-07 NOTE — H&P (Signed)
PATIENT NAME:  Scott [name], Scott [name] MR#:  [id number] DATE OF BIRTH:  03/17/1924  DATE OF ADMISSION:  06/26/2013  PRIMARY CARE PHYSICIAN: Dr. Darreld McleanLinda Miles at [facility].  PRIMARY PULMONOLOGIST: Dr. Freda MunroSaadat Khan.   PRIMARY CARDIOLOGIST: Dr. Arnoldo HookerBruce Kowalski.   REFERRING EMERGENCY ROOM PHYSICIAN: Dr. Janalyn Harderavid Kaminski.   CHIEF COMPLAINT: Cough and shortness of breath.   HISTORY OF PRESENT ILLNESS: An 81 year old male with past history of coronary artery disease, COPD, chronic oxygen use of 2 liters at home, hyperlipidemia, and hypertension. For the last 3 days had complaint of cough and feeling some shortness of breath, had also some sputum, and his primary care physician's office was closed and could not get appointment with Dr. Milta DeitersKhan's office so finally called Dr. Philemon KingdomKowalski's office and went over there to see him. Dr. Gwen PoundsKowalski saw him getting shortness of breath and having cough, so he sent him into the ER for further evaluation. On work-up, he was found to have elevated white cell count, increased requirement of oxygen, and having some consolidation on his chest x-ray on lungs, so ER physician gave to hospitalist team for admission. On further questioning, the patient states that he did not have any fever but has some soreness in his chest, which is on the right side and gets worse with cough.   REVIEW OF SYSTEMS:    CONSTITUTIONAL: Negative for fever, fatigue, weakness, pain or weight loss.  EYES: No blurring, double vision, discharge or redness.  EARS, NOSE, THROAT: No tinnitus, ear pain, hearing loss.  RESPIRATORY: Has some cough and some shortness of breath.  CARDIOVASCULAR: No chest pain, orthopnea, edema, arrhythmia, palpitations.  GASTROINTESTINAL: No nausea, vomiting, diarrhea, abdominal pain.  GENITOURINARY: No dysuria, hematuria, increased frequency.  ENDOCRINE: No heat or cold intolerance. No excessive sweating.  MUSCULOSKELETAL: Joints: No swelling or tenderness in the joints.  NEUROLOGICAL:  No numbness, weakness, tremor, or vertigo.  SKIN: No acne, rashes, or lesions on the skin.  PSYCHIATRIC: No anxiety, insomnia, bipolar disorder.   PAST MEDICAL HISTORY: 1.  COPD, 2 liters oxygen use at home chronically.  2.  Obstructive sleep apnea, CPAP use.  3.  Coronary artery disease, one stent 21 years ago.  4.  Hyperlipidemia.   SOCIAL HISTORY: Longtime smoker. He quit smoking in 1991. No alcohol use. Lives with wife at home. Does not require any cane or walker.   FAMILY HISTORY: Mother had dementia and CHF.   HOME MEDICATIONS: Still need to be reviewed by pharmacy technician, so will update once it is done.   PHYSICAL EXAMINATION:  VITAL SIGNS: In ER, temperature 98.2, pulse rate on presentation 129, respirations 22, and blood pressure 101/61. Oxygen saturation 88 on 2 liters oxygen, and it came to 98 with 4 liters oxygen supplementation via nasal cannula.  GENERAL: The patient is alert and oriented to time, place, and person. Slight distress due to cough.  HEENT: Head and neck atraumatic. Conjunctivae pink. Oral mucosa moist.  NECK: Supple. No JVD.  RESPIRATORY: Bilateral equal air entry. There is some crepitation present in right lower lobe.  CARDIOVASCULAR: S1, S2 present, regular. No murmur.  ABDOMEN: Soft, nontender. Bowel sounds present. No organomegaly.  SKIN: No rashes.  EXTREMITIES: Legs: No edema.  MUSCULOSKELETAL: Joints: No swelling or tenderness.  NEUROLOGICAL: Power 5/5. Follows commands. No tremor or rigidity. Moves all 4 limbs. Sensation is intact.  PSYCHIATRIC: No acute psychiatric illness at this time.   IMPORTANT LABORATORY AND RADIOLOGICAL DATA: Chest x-ray portable: No pulmonary edema, chronic interstitial prominence and  fibrotic changes, hazy bilateral basilar atelectasis or infiltrate.   BNP is 1009, glucose 107, BUN 16, creatinine 0.95, sodium 137, potassium 3.4; chloride is 104; CO2 is 25; calcium is 9.4. Total protein 8.4. Albumin 2.9, bilirubin 1,  alkaline phosphate 124, SGOT 30, and SGPT 17. Troponin less than 0.02. WBC 14.7, hemoglobin 14.1; platelet count is 248 and MCV is 87.   ASSESSMENT AND PLAN: An 81 year old male with past history of coronary artery disease, hyperlipidemia, chronic obstructive pulmonary disease, and chronic oxygen use at home, came with complaint of cough and sputum for 3 to 4 days and found having some infiltrate in the lung base.  1.  Pneumonia causing sepsis and acute on chronic respiratory failure: Sepsis is evident by tachycardia, hypoxia, and increased white cell count. His oxygen use 2 liters at home is currently requiring 4 liters to maintain the saturation more than 90. Will treat pneumonia with Rocephin and azithromycin as community-acquired pneumonia, and will also gave him for his chronic obstructive pulmonary disease Solu-Medrol and nebulizer treatment. Try to get sputum culture and incentive spirometry.  2.  Chronic obstructive pulmonary disease exacerbation: The patient has shortness of breath and worsening with baseline pulmonary fibrosis and chronic obstructive pulmonary disease, so will give him IV Solu-Medrol, DuoNebs, Spiriva, and treat his pneumonia with antibiotics.  3.  Obstructive sleep apnea: Continue CPAP at night.  4.  Hyperlipidemia: Continue statin.  5.  Hypokalemia: Will replace orally.   CODE STATUS: Full code.   TOTAL TIME SPENT ON THIS ADMISSION: 50 minutes.    ____________________________ Hope Pigeon Elisabeth Pigeon, MD vgv:jcm D: 06/26/2013 18:07:00 ET T: 06/26/2013 18:36:48 ET JOB#: [id number]  cc: Leanna Sato, MD Hope Pigeon Elisabeth Pigeon, MD, <Dictator>   Altamese Dilling MD ELECTRONICALLY SIGNED 07/09/2013 16:30

## 2014-06-07 NOTE — Consult Note (Signed)
Admitted with pneumonia. Had episodes of hematemesis early this AM. No prior UGI bleeding. Apparently had normal EGD 3-4 yrs ago by Dr. Mechele CollinElliott. Does have intermittent hx of indigestion. Breathing ok. Denies SOB.  Some crackles on R lung base. Sl drop in hgb. NPO all day today. Discussed doing EGD this afternoon. Pt and family agreed to have EGD done. O2 sat 95% on 2L. Thanks   Electronic Signatures: Lutricia Feilh, Adolphus Hanf (MD) (Signed on 15-May-15 13:20)  Authored   Last Updated: 15-May-15 13:21 by Lutricia Feilh, Katriana Dortch (MD)

## 2014-06-07 NOTE — Discharge Summary (Signed)
PATIENT NAME:  Scott [name], Scott [name] MR#:  [id number] DATE OF BIRTH:  Sep 27, 1924  DATE OF ADMISSION:  12/15/2013 DATE OF DISCHARGE:  12/20/2013  DISPOSITION: The patient was discharged to hospice home.   DISCHARGE DIAGNOSES:  1.  Acute on chronic respiratory failure with pulmonary fibrosis and chronic obstructive pulmonary disease.  2.  Small bowel obstruction, status post emergent surgery to remove infarcted bowel.  3. Tachycardia.  4.  Hypotension.  5.  History of coronary artery disease.  6.  Gastroesophageal reflux disease.  HISTORY OF PRESENT ILLNESS: An 81 year old male with pulmonary fibrosis and COPD on home oxygen presented to Emergency Room with abdominal pain and found having partial small bowel obstruction. White cell count was elevated. Admitted to medical services as was not [name] good surgical candidate initially, but later on because of bowel ischemia was taken for surgery on Levophed drip and ventilator support. Initially managed in critical care unit. Later extubated. After extubation continued to have respiratory distress and so family meeting with palliative care was done and they decided not to re-intubate him and him comfort care. So transferred to hospice home for further management.   OTHER ISSUES IN THIS HOSPITAL COURSE:  1.  Hypotension, as mentioned above, initially was on Levophed drip but came off and blood pressure was stable.  2.  History of coronary artery disease, being on ventilator and hypotensive. His medications were off.   CONSULTATIONS DURING THE HOSPITAL COURSE: Ned GraceNancy Phifer, MD for palliative care; Dory LarsenKurian D. Kasa, MD for pulmonary and Quentin Orealph L. Ely III, MD for surgical.  IMPORTANT LABORATORY RESULTS: WBC count on presentation 19,000, hemoglobin 15.5, and platelet count 161,000. Creatinine 1.12, sodium 139, potassium 5, CO2 of 23, lactic acid 1.8 on admission. WBC count went up to 23,000 and hemoglobin 18 next day. Lactic acid level was 2.3 and abdominal x-ray shows  persistent partial small bowel obstruction. On ABG, pH was 7.27. WBC count came down to 15,000 on the 5th of November. Creatinine was 0.75 on 5th of November.   DISCHARGE MEDICATIONS: Suggested for hospice home to give morphine and Ativan for his comfort and Zofran for symptomatic management for nausea.   TOTAL TIME SPENT ON THIS DISCHARGE SUMMARY: Thirty-five minutes.    ____________________________ Hope PigeonVaibhavkumar G. Elisabeth PigeonVachhani, MD vgv:TT D: 12/25/2013 17:37:47 ET T: 12/25/2013 21:17:25 ET JOB#: [id number]  cc: Hope PigeonVaibhavkumar G. Elisabeth PigeonVachhani, MD, <Dictator> Altamese DillingVAIBHAVKUMAR Malgorzata Albert MD ELECTRONICALLY SIGNED 01/07/2014 19:11

## 2014-06-07 NOTE — H&P (Signed)
PATIENT NAME:  Scott [name], Rodel A MR#:  [id number] DATE OF BIRTH:  04-27-1924  DATE OF ADMISSION:  06/26/2013  ADDENDUM TO HISTORY AND PHYSICAL  HOME MEDICATIONS: 1.  Ventolin 2 puff inhalation 4 times a day.  2.  Spiriva 18 mcg inhalation 1 capsule inhalation once a day.  3.  Symbicort 1 puff inhalation 2 times a day.  4.  Loratadine 10 mg oral capsule once a day.  5.  Lipitor 10 mg oral tablet once a day.  6.  Fluticasone nasal spray 1 spray nasal once a day.  7.  Cardizem 120 mg extended-release capsule once a day.  8.  Aspirin 81 mg once a day.  9.  Alphagan 0.1% ophthalmic solution 1 drop each eye once a day.  10.  Albuterol and ipratropium inhalation solution 3 mL inhalation nebulizer 4 times a day as needed for wheezing.    ____________________________ Hope PigeonVaibhavkumar G. Elisabeth PigeonVachhani, MD vgv:jcm D: 06/26/2013 21:36:31 ET T: 06/26/2013 21:42:31 ET JOB#: [id number]  cc: Hope PigeonVaibhavkumar G. Elisabeth PigeonVachhani, MD, <Dictator> Altamese DillingVAIBHAVKUMAR Madonna Flegal MD ELECTRONICALLY SIGNED 07/09/2013 16:30

## 2014-06-07 NOTE — Consult Note (Signed)
EGD showed diffuse reflux esophagitis, GE junction stricture, and HH. Esophageal bx taken. Stricture dilated wtih 54 Fr Malody dilator. Will start protonix bid.Resume diet. Thanks.  Electronic Signatures: Lutricia Feilh, Akia Desroches (MD)  (Signed on 15-May-15 14:11)  Authored  Last Updated: 15-May-15 14:11 by Lutricia Feilh, France Lusty (MD)

## 2014-06-07 NOTE — Consult Note (Signed)
   Comments   Josh Borders, NP, and I met with pt's wife, daughter, son-in-law and sister. Updated them on pt's current condition. We discussed in detail the options of continued aggressive care vs less aggressive care. Pt has a living will that states he would never want to be on ventilator long-term. Family understand that if pt it reintubated, pt may not be able to come off vent. They have decided that pt should be a DNR.  feel that pt is in distress. He is moaning, keeps trying to pull off his face mask, has on mitts to keep him from doing this, etc. They are in agreement with the use of comfort meds and transition to 02 by [place].   Electronic Signatures: Chandni Gagan, Izora Gala (MD)  (Signed [phone] 11:46)  Authored: Palliative Care   Last Updated: 05-Nov-15 11:46 by Medea Deines, Izora Gala (MD)

## 2014-06-07 NOTE — Op Note (Signed)
PATIENT NAME:  Scott [name], Scott [name] MR#:  [id number] DATE OF BIRTH:  Feb 20, 1924  DATE OF PROCEDURE:  12/16/2013  PREOPERATIVE DIAGNOSIS: Ischemic bowel.   POSTOPERATIVE DIAGNOSIS: Ischemic small intestine.   SURGERY: Exploratory laparotomy and small bowel resection, placement of right internal jugular central line.   SURGEON: Quentin Orealph L. Ely, MD   ANESTHESIA: General.   OPERATIVE PROCEDURE: With the patient in the supine position, after introduction of general anesthesia, placement of arterial line, Foley catheter was placed and then [name] right internal jugular central line was placed on [name] single pass using ultrasound guidance. The patient's abdomen was then prepped with Betadine and draped with sterile towels. An alcohol wipe and Betadine impregnated Steri-Drape were utilized. [name] midline incision was made from just above the umbilicus to just below the umbilicus, carried down through subcutaneous tissue using Bovie electrocautery. Midline fascia was identified and opened the length of the skin incision, as was the peritoneum. [name] large amount of bloody fluid was encountered. Severely ischemic thickened bowel was immediately identified in the distal ileum. It protruded into the incision and was gradually brought out for better visualization. The bowel appeared severely discolored with no obvious pulsations in the mesentery. The mesentery was very thick and the venous supply appeared to be thrombosed. The area involved was approximately 25 cm. The remainder of the bowel appeared to be in reasonable shape, although there was [name] couple of other areas that were edematous without the same color changes or mesenteric changes. The bowel was run from ligament of Treitz to ileocecal valve. The colon did not appear to be involved. There was [name] good pulse in the superior mesenteric artery. The rest of the mesentery appeared to have good pulses in it. I elected to remove the severely ischemic segment of bowel. The bowel was divided  with [name] GIA-75 stapling device, the mesentery taken down using the LigaSure device. Two loops of bowel were placed side by side and handsewn anastomosis and double layer was created end-to-side using 3-0 silk in the seromuscular sutures and 3-0 Vicryl in the mucosal layer. The mesentery defect was closed with 3-0 Vicryl. The abdomen was copiously irrigated with warm saline solution. Bowel contents were returned to their anatomic position. Stay sutures of #2 nylon were placed over bolsters. The midline fascia was closed with [name] running interlocking #1 PDS from each end, tying in the middle, burying the knot. The skin was clipped. Sterile dressing was applied. The patient was returned to the recovery room, having tolerated the procedure well. Sponge. instrument and needle count correct x 2 in the operating room.   ____________________________ Quentin Orealph L. Ely III, MD rle:TT D: 12/16/2013 14:28:23 ET T: 12/16/2013 22:48:55 ET JOB#: [id number]  cc: Carmie Endalph L. Ely III, MD, <Dictator> Leanna SatoLinda M. Miles, MD Yevonne PaxSaadat [name]. Khan, MD Quentin OreALPH L ELY MD ELECTRONICALLY SIGNED 12/25/2013 13:46

## 2014-06-07 NOTE — Consult Note (Signed)
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   Patient was seen by consultant.   Orders entered.   Discussed with Attending MD.   Comments: [age] yom with pulmonary fibrosis, who had a stem cell Txp of some kind in the last few months and hasn't been out of the house since August. He can only walk 20 feet on flat ground without stopping for SOB; he cannot climb a single step. He has a possible PSBO by history and has significant constant abdominal pain and tenderness, although he has no peritoneal signs (no rebound or guarding) and no distention or tympany. His ???WBC is also concerning, but his CT findings are subtle and he had a small, hard BM today, is passing lots of flatus, and has stool in his right and transverse colons by CT. Most of his vomiting has been post PO contrast. Since he is CPAP and O2 dependent, we will hold off on a NG for now.  Even if he does not have PAH (which he might), this patient's operative mortality is very high, and with no clear indications for acute surgical intervention, it would be best to have him managed primarily by IM. We are happy to evaluate him daily as consultants. Dr Egbert GaribaldiBird will see him later today.  Electronic Signatures: Claude MangesMarterre, Kavan Devan F (MD)  (Signed [phone]) [id number]-Nov-15 03:23)  Authored: Brief Consult Note   Last Updated: 01-Nov-15 03:23 by Claude MangesMarterre, Naphtali Riede F (MD)

## 2014-06-07 NOTE — Discharge Summary (Signed)
PATIENT NAME:  Scott [name], Scott [name] MR#:  [id number] DATE OF BIRTH:  1924-11-09  DATE OF ADMISSION:  06/26/2013 DATE OF DISCHARGE:  06/29/2013  DISCHARGE DIAGNOSES: 1. Acute on chronic respiratory failure and chronic obstructive pulmonary disease exacerbation and pneumonia.  2. Esophagitis.  Advised by gastroenterology to have  PPI b.i.d. for two months.  3. Obstructive sleep apnea.  4. Pneumonia.   CONDITION ON DISCHARGE: Stable.   CODE STATUS: Full code.   DISCHARGE MEDICATIONS: 1. Lipitor 10 mg oral tablet once a day.  2. Loratadine 10 mg oral capsule once a day.  3. Spiriva 18 mcg inhalation capsule once a day.  4. Ventolin 2 puff inhalation 4 times a day as needed for wheezing.  5. Albuterol and ipratropium 3 mL inhalation 4 times a day as needed for wheezing.  6. Symbicort 1 puff inhalation 2 times a day.  7. Alphagan 0.1% ophthalmic solution one drop each affected eye 2 times a day.  8. Cardizem 120 mg extended release tablet once a day.  9. Fluticasone nasal spray once per day each nostril once a day.  10. Prednisone 10 mg tablets, start at 60 mg and taper x 10 mg daily until complete.  11. Ceftin 500 mg oral tablet 2 times a day for three days.  12. Azithromycin 500 mg oral tablet once a day for three days.  13. Pantoprazole 40 mg oral delayed-release tablets oral 2 times a day.   Advised to stop taking aspirin.   DIET ON DISCHARGE: Regular. Diet consistency, regular. Follow-up with primary care physician and pulmonologist in 1 to 2 weeks. Advised to avoid NSAIDS due to esophagitis.   HISTORY OF PRESENT ILLNESS: An 81 year old male with past medical history of coronary artery disease, chronic obstructive pulmonary disease, chronic oxygen use of 2 liters at home, hyperlipidemia and hypertension. For three days had complaint of cough and feeling some shortness of breath, could not get appointment with Dr. Santo Held office, so finally went to Dr. Philemon Kingdom office. He was very short of  breath and having some cough, so Dr. Gwen Pounds decided to send him to the Emergency Room for further evaluation. On work-up he was found having elevated white cell count and increased requirement of oxygen and having some consolidation on his chest x-ray, so emergency physician gave hospitalist team for admission for further management of his pneumonia.   HOSPITAL COURSE AND STAY:  1. Pneumonia with sepsis, which was present on admission. Treated with Rocephin and azithromycin and the patient felt better, came to his baseline of 2 to 2.5 liters of oxygen, so we discharged him home on oral antibiotics.  2. Obstructive sleep apnea. We continued CPAP at night in the hospital.  3. Acute on chronic respiratory failure with chronic obstructive pulmonary disease exacerbation. He was treated with IV steroid nebulizer treatment, Spiriva and oxygen and after improvement we switched steroids to oral.  4. Hypokalemia. We replaced it.  5. Gastrointestinal bleed. The patient had an episode of coffee ground vomit in the hospital. GI consult was called in and they did upper endoscopy which showed some esophagitis and advised to have proton pump inhibitor twice daily for two months and have follow-up in GI and avoid NSAIDS for pain. These instructions were given to patient and discharged him home.   CONSULTS IN HOSPITAL: Dr. Lutricia Feil for GI.  IMPORTANT LABORATORY RESULTS: BNP was 1009, glucose 107, creatinine 0.95, sodium 137, potassium is 3.4, chloride is 104 and CO2 is 25. WBC 14.7, hemoglobin is 14.1,  platelet count is 248, and MCV is 87.   Chest x-ray, portable on admission, no pulmonary edema, chronic interstitial prominence, bilaterally basal atelectasis or infiltrate. Blood cultures were negative on admission. WBC was 10.7. Sputum culture. Sputum growing rare gram-negative rods. final culture is awaited, but  the patient had significant improvement in symptoms, so discharged with the same antibiotics orally.    TOTAL TIME SPENT ON THIS DISCHARGE: 40 minutes.   ____________________________ Hope PigeonVaibhavkumar G. Elisabeth PigeonVachhani, MD vgv:sg D: 06/30/2013 07:56:14 ET T: 06/30/2013 10:36:22 ET JOB#: [id number]  cc: Hope PigeonVaibhavkumar G. Elisabeth PigeonVachhani, MD, <Dictator> Leanna SatoLinda M. Miles, MD  Altamese DillingVAIBHAVKUMAR Mishael Haran MD ELECTRONICALLY SIGNED 07/09/2013 16:31

## 2014-06-07 NOTE — Consult Note (Signed)
Pt in bathroom. Wife and nurse present. No more vomiting or hematemesis. Breathing better. Continue PPI BID x min 8 weeks even after discharge. Will sign off. Call back if GI issues arise. Thanks.  Electronic Signatures: Lutricia Feilh, Karagan Lehr (MD)  (Signed on 16-May-15 09:50)  Authored  Last Updated: 16-May-15 09:50 by Lutricia Feilh, Tawana Pasch (MD)

## 2014-06-09 LAB — SURGICAL PATHOLOGY

## 2015-08-11 IMAGING — CT CT CHEST W/ CM
2 of 3 series · 15 of 36 positions shown, 18 images · IV contrast (isovue)
Comparison: Chest radiographs dated 02/01/2013. CT chest dated
09/18/2012.

CLINICAL DATA: Shortness of breath, cough, fever, suspected
pneumonia.

EXAM:
CT CHEST WITH CONTRAST
TECHNIQUE: Multidetector CT imaging of the chest was performed during
intravenous contrast administration.
CONTRAST:  75 mL Isovue 370 IV

[Series 2: routine chest with · axial · 0.73mm/px · z∈[-832,-576]mm · 12 of 61 slices shown, 15 images]
[im 5/61  mediastinal]
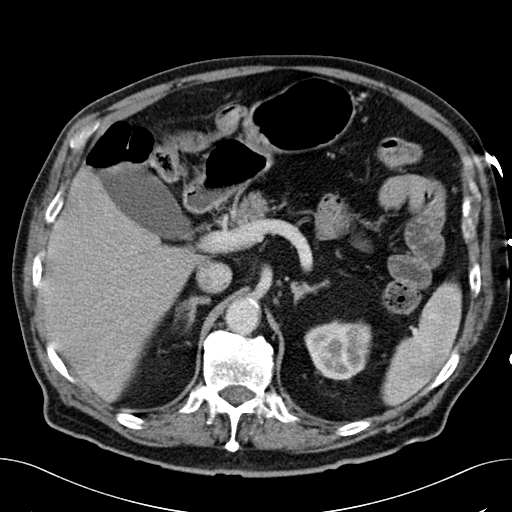
[im 5/61  lung]
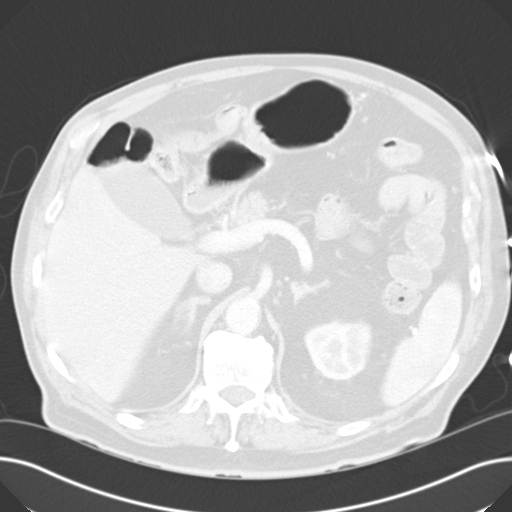
[im 9/61  lung]
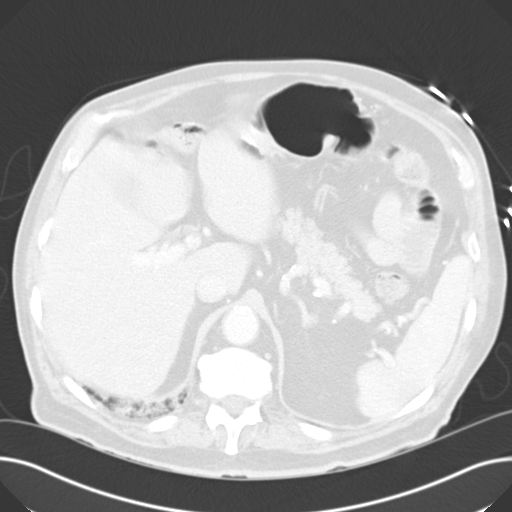
[im 14/61  lung]
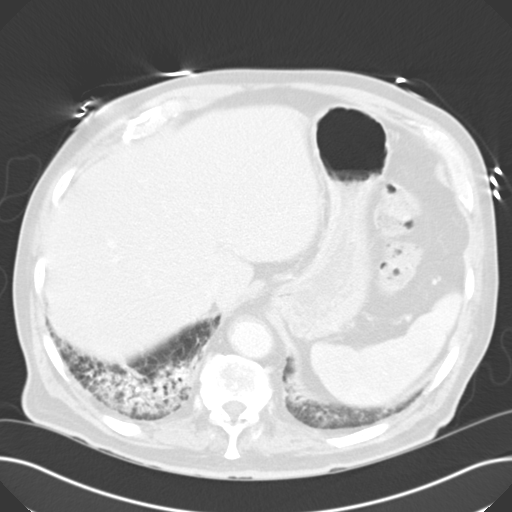
[im 18/61  lung]
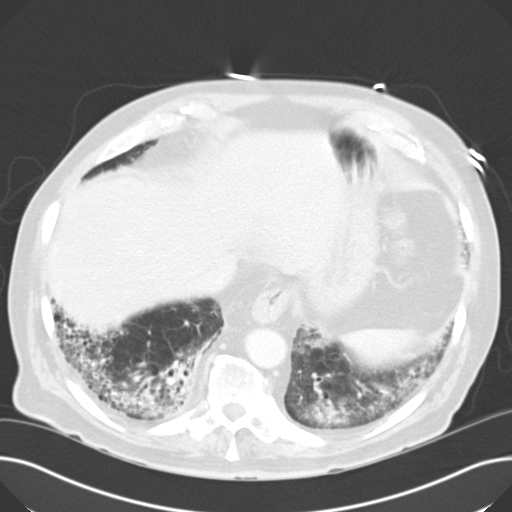
[im 23/61  mediastinal]
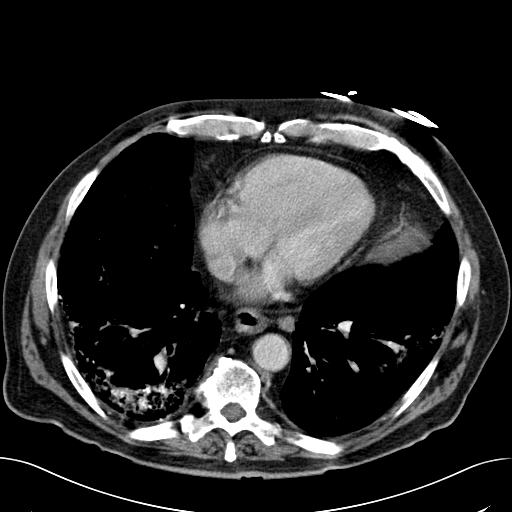
[im 23/61  lung]
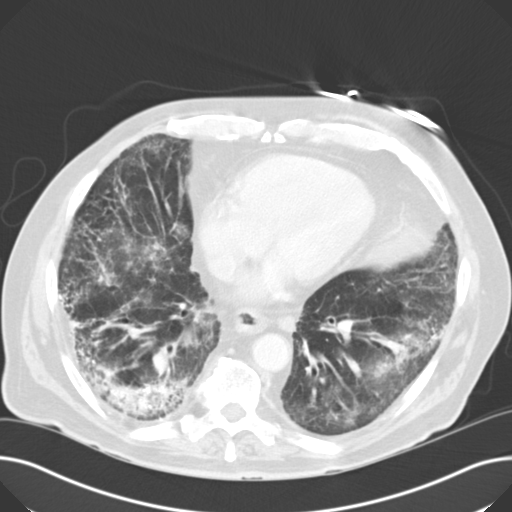
[im 27/61  lung]
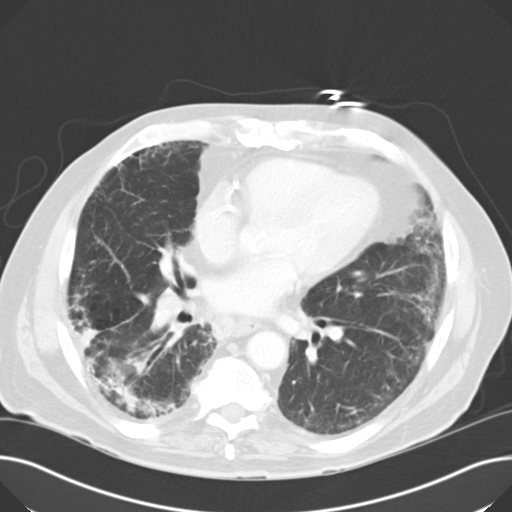
[im 34/61  lung]
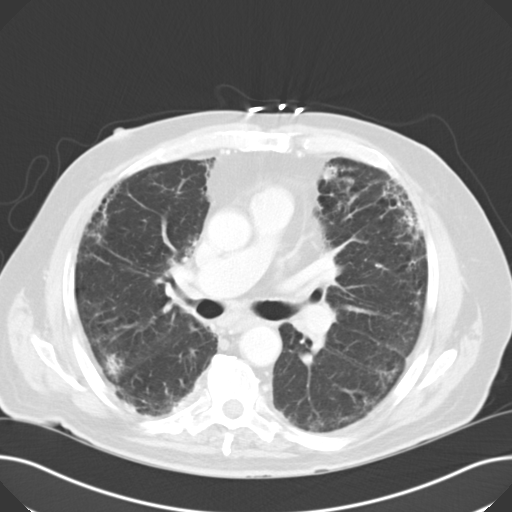
[im 38/61  lung]
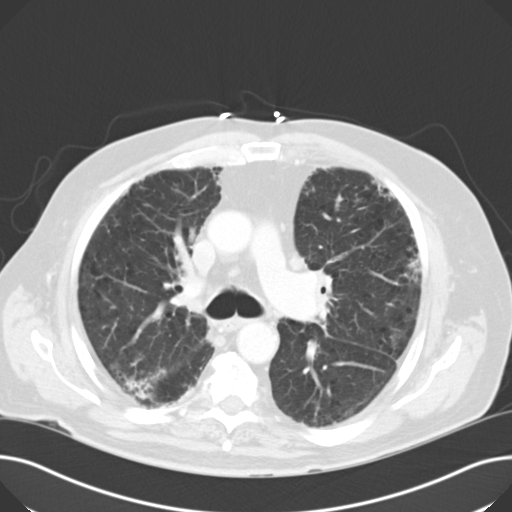
[im 43/61  mediastinal]
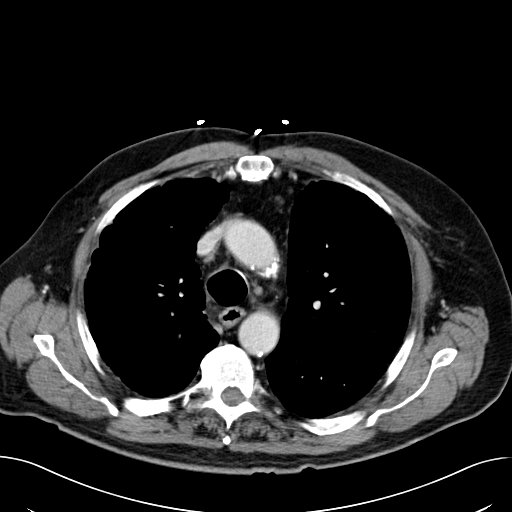
[im 43/61  lung]
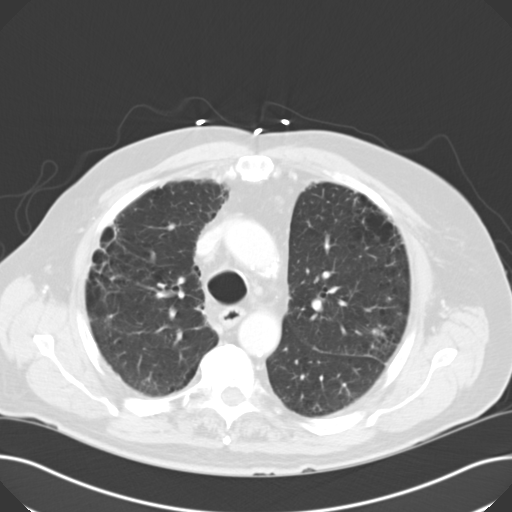
[im 47/61  lung]
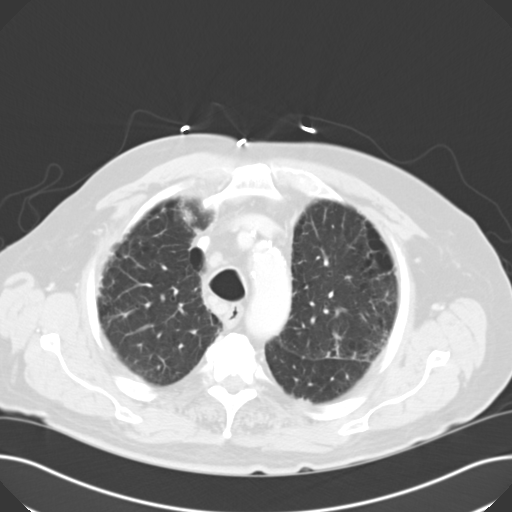
[im 52/61  lung]
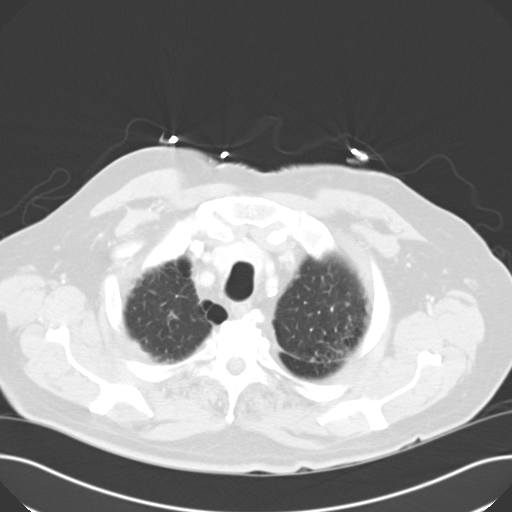
[im 56/61  lung]
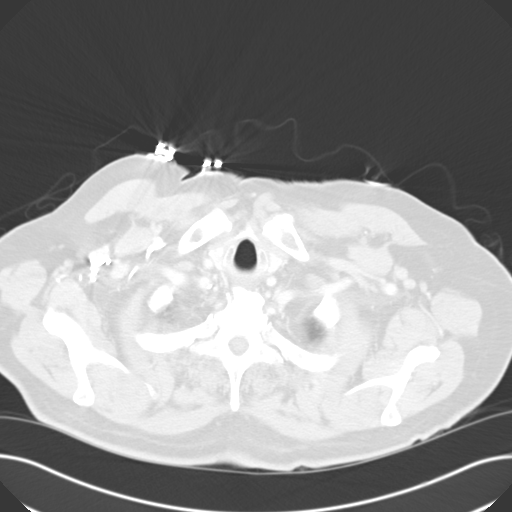

[Series 5: cor routine chest with · coronal · 0.60mm/px · 3 of 148 slices shown]
[im 30/148  lung]
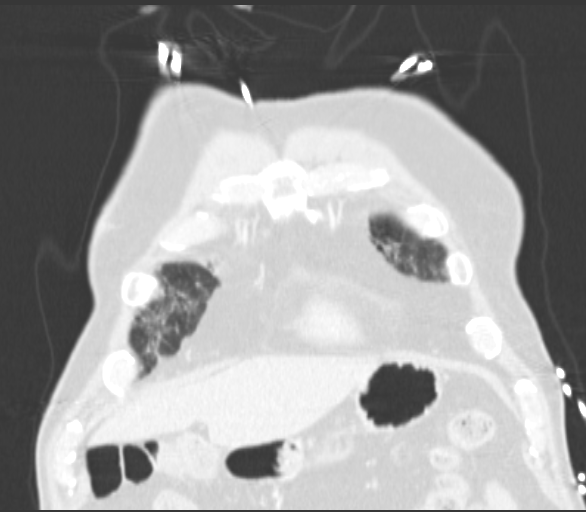
[im 59/148  lung]
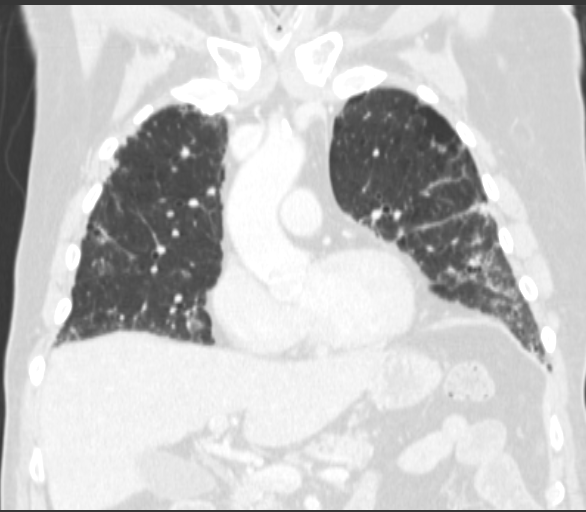
[im 89/148  lung]
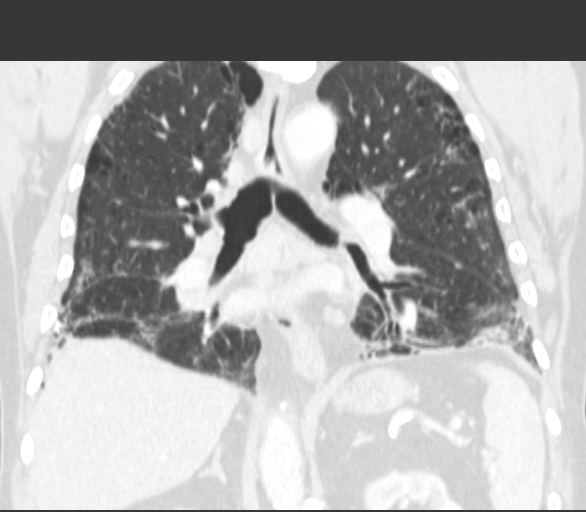

[15 of 36 positions shown; findings below may reference images not displayed]

FINDINGS: Mild centrilobular and paraseptal emphysematous changes.

Superimposed moderate subpleural reticulation/fibrosis throughout
all lobes, lower lobe predominant. Associated lower lobe
bronchiectasis. No frank honeycombing. This appearance is compatible
with chronic interstitial lung disease and has progressed from 7110
but is similar to recent CT.

Superimposed ground-glass opacity is present in the superior segment
right lower lobe (series 3/ image 32) and posterior left lower lobe
(series 3/image 39), suspicious for pneumonia.

No suspicious/discrete pulmonary nodules. No pleural effusion or
pneumothorax.

Visualized thyroid is unremarkable.

Mild mediastinal lymphadenopathy, likely reactive, including:

--10 mm short axis right paratracheal node (series 2/ image 17),
previously 7 mm

--10 mm short axis AP window node (series 2/image 20), previously 9
mm

--11 mm short axis AP window node (series 2/ image 26), previously
12 mm

--18 mm short axis subcarinal node (series 2/ image 32) previously
15 mm

Visualized upper abdomen is unremarkable.

Degenerative changes of the visualized thoracolumbar spine.
IMPRESSION: Moderate subpleural reticulation/fibrosis, lower lobe predominant,
compatible with chronic interstitial lung disease. This appearance
is unchanged from recent CT but has progressed from 7110.

Superimposed ground-glass opacity in the bilateral lower lobes,
suspicious for pneumonia.

Underlying mild emphysematous changes.
